# Patient Record
Sex: Male | Born: 1955 | Race: White | Hispanic: No | Marital: Married
Health system: Southern US, Community
[De-identification: ages and names within clinical notes are randomized; demographics above are authoritative.]

---

## 2012-09-11 ENCOUNTER — Ambulatory Visit: Payer: Self-pay | Admitting: Internal Medicine

## 2014-11-02 IMAGING — US US EXTREM LOW VENOUS*R*
1 series · 14 of 24 positions shown · non-contrast
Comparison: none

REASON FOR EXAM: Right leg swelling in back of calf Eval for DVT
COMMENTS:

[Series 1: us extrem low venous*right* · 0.09mm/px · 14 of 42 slices shown]
[im 1/42]
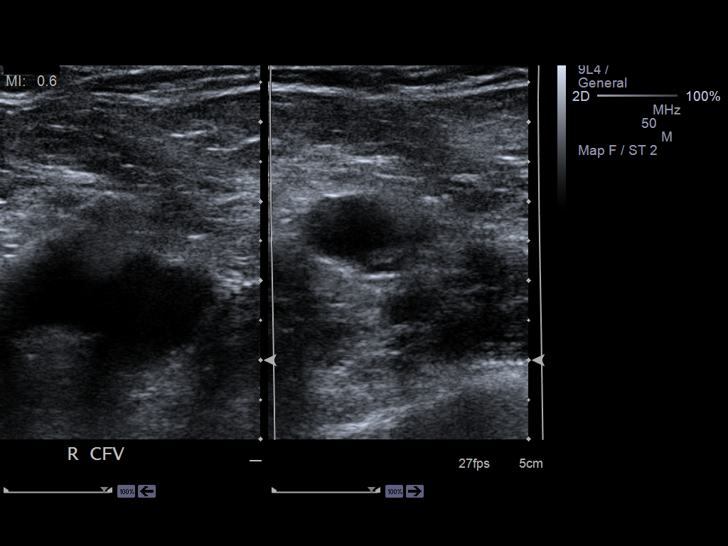
[im 4/42]
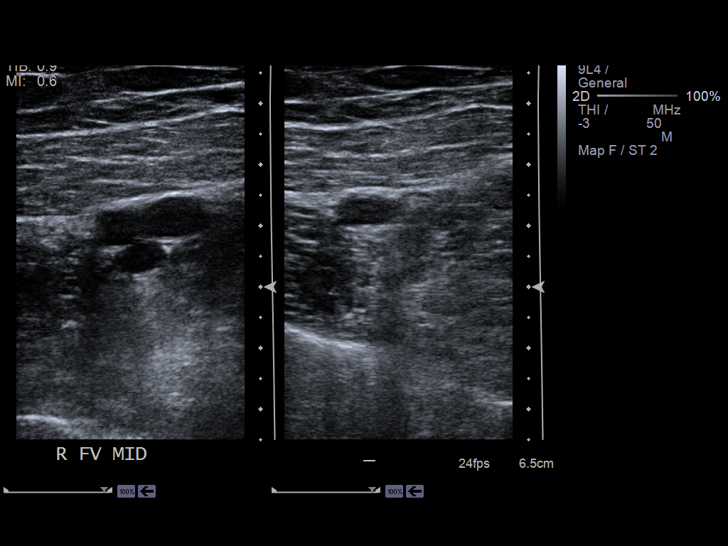
[im 8/42]
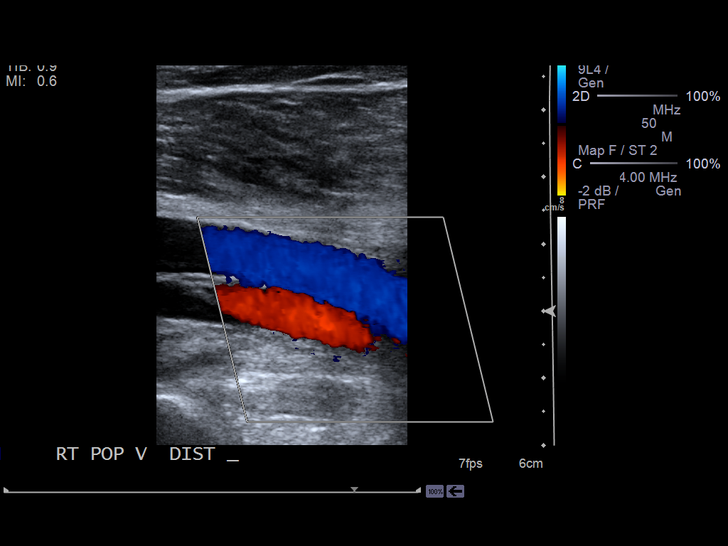
[im 11/42]
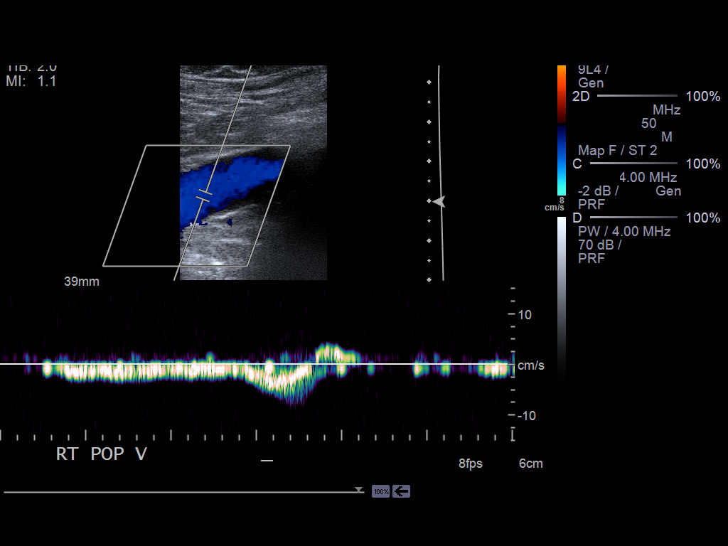
[im 13/42]
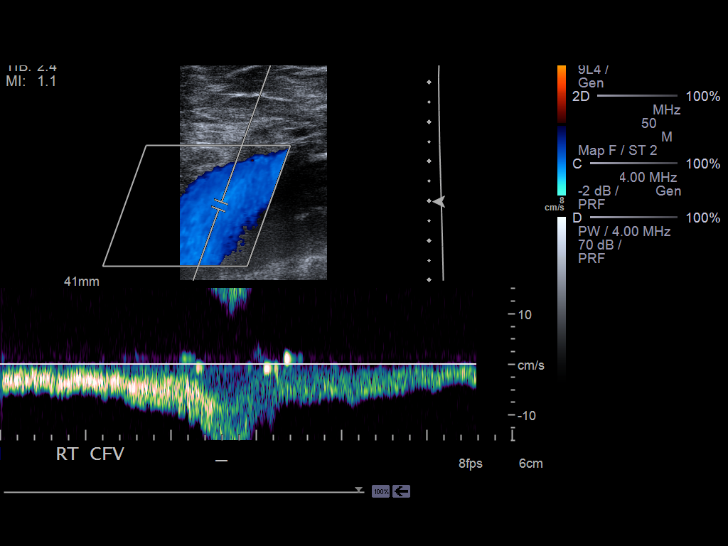
[im 17/42]
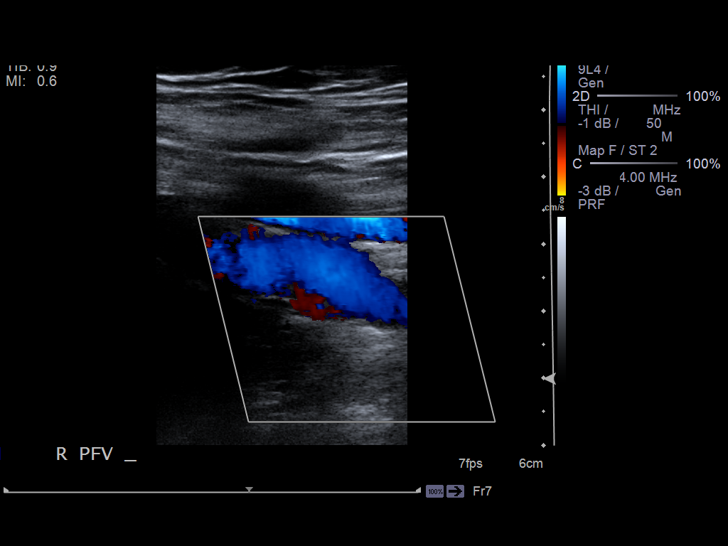
[im 20/42]
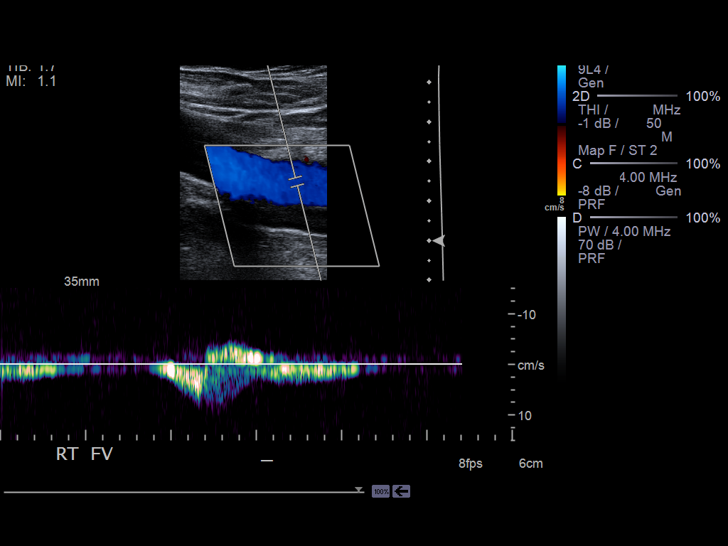
[im 22/42]
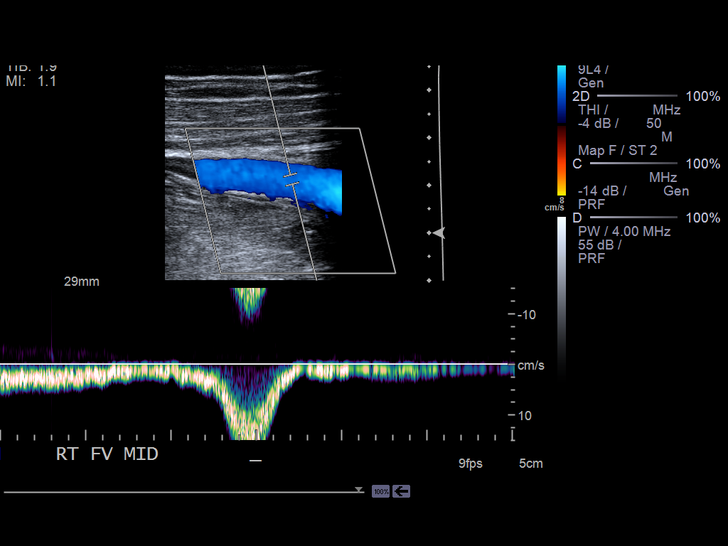
[im 25/42]
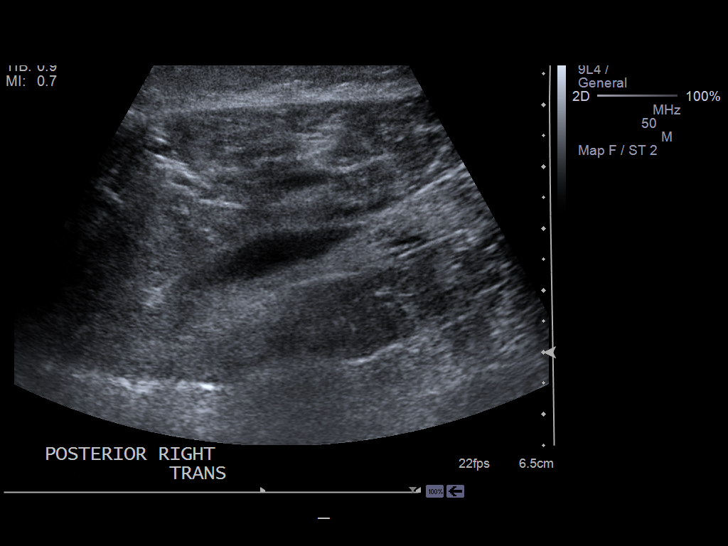
[im 29/42]
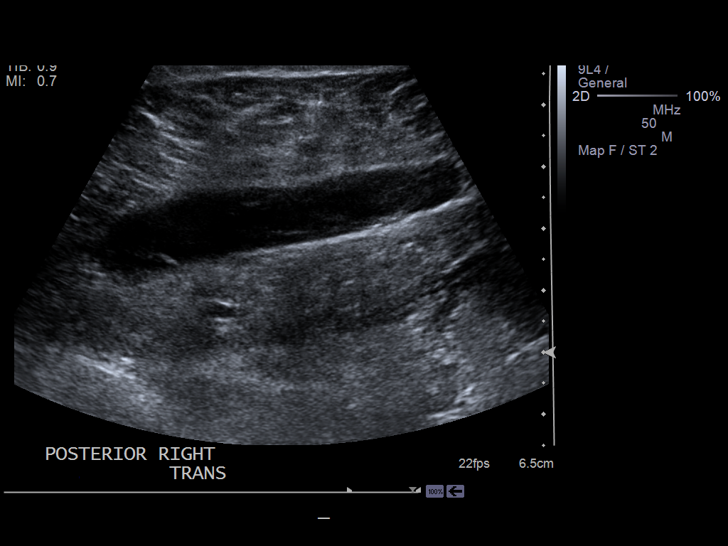
[im 33/42]
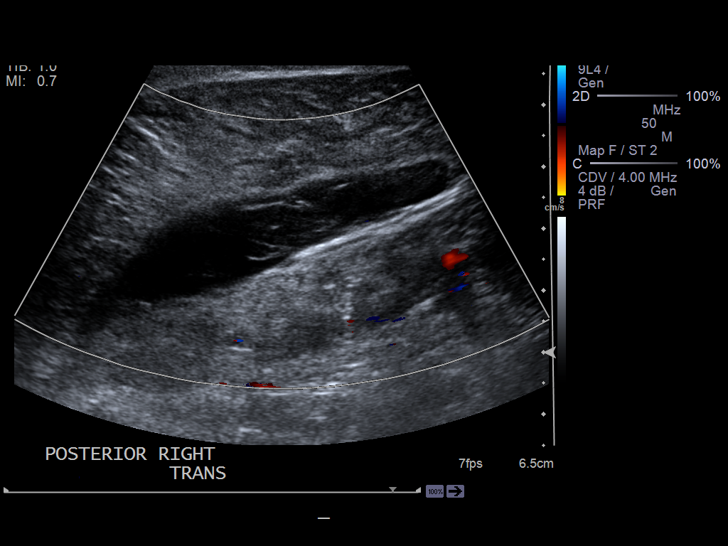
[im 34/42]
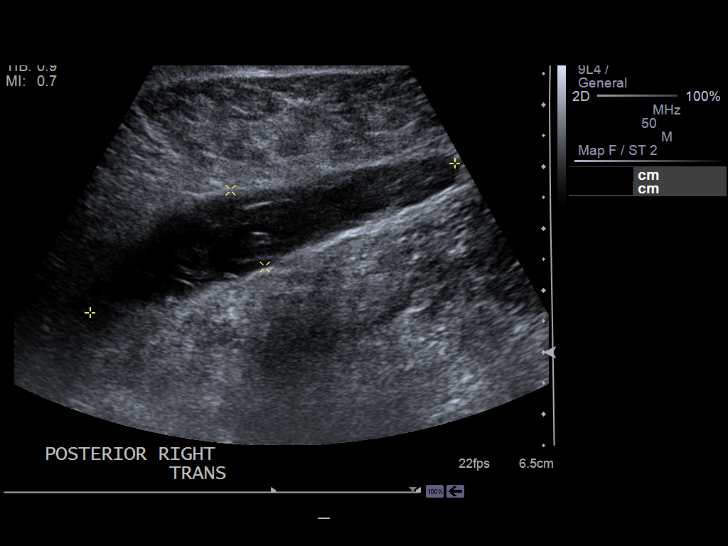
[im 38/42]
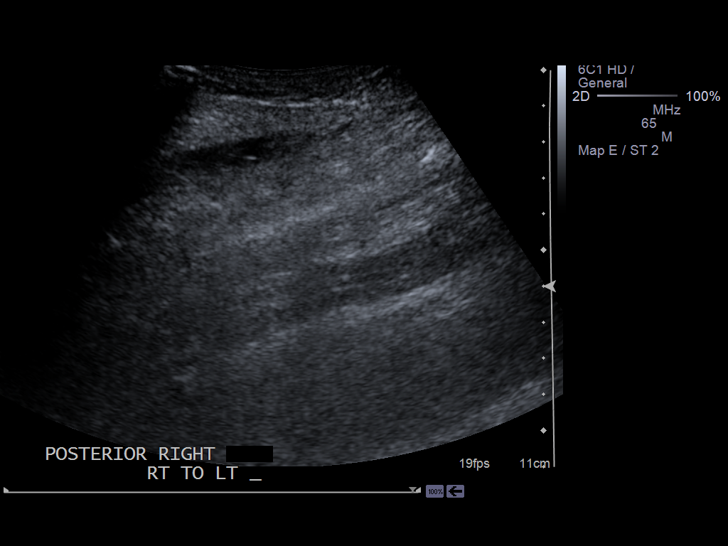
[im 42/42]
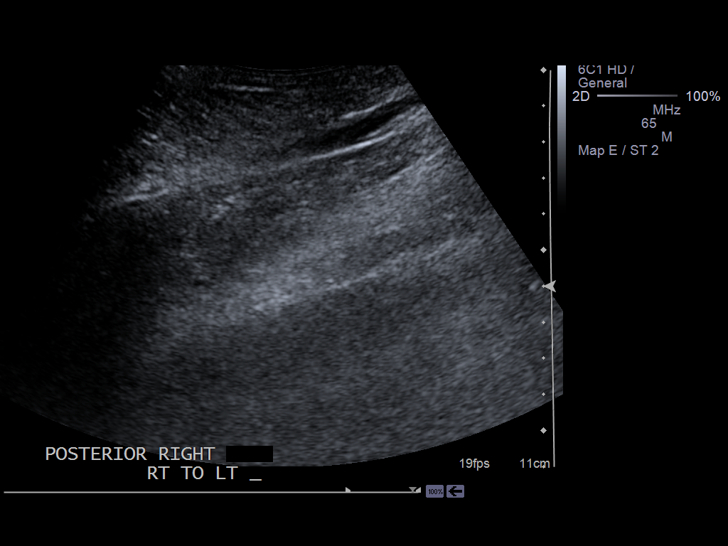

[14 of 24 positions shown; findings below may reference images not displayed]

PROCEDURE:     T-FLOW - T-FLOW DOPPLER LOW EXTR RIGHT  - September 11, 2012 [DATE]

RESULT:     Grayscale and color flow Doppler techniques were employed to
evaluate the deep veins of the right lower extremity.

The right common femoral, superficial femoral, and popliteal veins are
normally compressible. The waveform patterns are normal and the color flow
images are normal. The response to the augmentation and Valsalva maneuvers
is normal.

In the posterior aspect of the right calf there is elliptical hypoechoic
focus with internal echoes measuring 11.4 x 6.4 x 1.4 cm. This likely
reflects a hematoma.
IMPRESSION: 1. There is no evidence of thrombus within the right femoral or popliteal
veins.
2. There is a fluid collection in the posterior aspect of the right calf
which likely reflects a hematoma.

[REDACTED]

## 2018-06-26 DIAGNOSIS — C4491 Basal cell carcinoma of skin, unspecified: Secondary | ICD-10-CM

## 2018-06-26 HISTORY — DX: Basal cell carcinoma of skin, unspecified: C44.91

## 2019-02-11 ENCOUNTER — Other Ambulatory Visit: Payer: Self-pay

## 2019-02-11 DIAGNOSIS — Z20822 Contact with and (suspected) exposure to covid-19: Secondary | ICD-10-CM

## 2019-02-14 LAB — NOVEL CORONAVIRUS, NAA: SARS-CoV-2, NAA: DETECTED — AB

## 2019-10-27 ENCOUNTER — Other Ambulatory Visit: Payer: Self-pay

## 2019-10-27 ENCOUNTER — Ambulatory Visit: Payer: BC Managed Care – PPO | Admitting: Dermatology

## 2019-10-27 DIAGNOSIS — L821 Other seborrheic keratosis: Secondary | ICD-10-CM

## 2019-10-27 DIAGNOSIS — D229 Melanocytic nevi, unspecified: Secondary | ICD-10-CM

## 2019-10-27 DIAGNOSIS — L209 Atopic dermatitis, unspecified: Secondary | ICD-10-CM | POA: Diagnosis not present

## 2019-10-27 DIAGNOSIS — Z1283 Encounter for screening for malignant neoplasm of skin: Secondary | ICD-10-CM

## 2019-10-27 DIAGNOSIS — L570 Actinic keratosis: Secondary | ICD-10-CM | POA: Diagnosis not present

## 2019-10-27 DIAGNOSIS — D1800 Hemangioma unspecified site: Secondary | ICD-10-CM

## 2019-10-27 DIAGNOSIS — L820 Inflamed seborrheic keratosis: Secondary | ICD-10-CM

## 2019-10-27 DIAGNOSIS — L814 Other melanin hyperpigmentation: Secondary | ICD-10-CM

## 2019-10-27 DIAGNOSIS — Z85828 Personal history of other malignant neoplasm of skin: Secondary | ICD-10-CM

## 2019-10-27 DIAGNOSIS — L578 Other skin changes due to chronic exposure to nonionizing radiation: Secondary | ICD-10-CM

## 2019-10-27 NOTE — Patient Instructions (Signed)
Atopic Dermatitis  "Dermatitis" means inflammation of the skin.  "Atopic" dermatitis is a particular type of skin inflammation that is marked by dryness, associated itching, and a characteristic pattern of rash on the body.  The condition is fairly common and may occur in as many as 10% of children.  You will often hear it called "atopic eczema" or sometimes just "eczema".  The exact cause of atopic dermatitis is unknown.  In many patients, there is a family history of hay fever, asthma, or atopic dermatitis itself.  Rarely, atopic dermatitis in infants may be related to food sensitivity, such as sensitivity to milk, but this is often difficult to determine and manage.  In the majority of cases, however, no allergic triggers can be found.  Physical or emotional stressors (severe seasonal allergies, physical illness, etc.) can worsen atopic dermatitis.  Atopic dermatitis usually starts in infancy from the ages of 2 to 6 months.  The skin is dry and the rash is quite itchy, so infants may be restless and rub against the sheets or scratch (if able).  The rash may involve the face or it may cover a large part of the body.  As the child gets older, the rash may become more localized.  In early childhood, the rash is commonly on the legs, feet, hands and arms.  As a child becomes older, the rash may be limited to the bend of the elbows, knees, on the back of the hands, feet, and on the neck and face.  When the rash becomes more established, the dry itchy skin may become thickened, leathery and sometimes darker in coloration.  The more the person scratches, the worse the rash is and the thicker the skin gets.  Many children with atopic dermatitis outgrow the condition before school age, while others continue to have problems into adolescence and adulthood.  Many things may affect the severity of the condition.  All patients have sensitive and dry skin.  Many will find that during the winter months when the humidity  is very low, the dryness and itchiness will be worse.  On the other hand, some people are easily irritated by sweat and will find that they have more problems during the summer months.  Most patients note an increase in itching at times when there are sudden changes in temperature.  Other irritants easily affect the skin of a patient with atopic dermatitis.  Use of harsh soaps or detergents and exposure to wool are common problems.  Sometimes atopic dermatitis may become infected by bacteria, yeast or viruses.  This is called "secondary infection".  Bacterial secondary infection is the most common and is often a result of scratching.  The rash gets very red with pus-filled pimples and scabs.  If this occurs, your doctor will prescribe an antibiotic to control the infection.  A more serious complication can be caused by certain viruses.  The "cold sore" virus (herpes simplex) may cause a severe rash.  If this is suspected, immediately contact your doctor.   What can I expect from treatment? Unfortunately, there is no "magic" cure that will always eliminate atopic dermatitis.  The main objective in treating atopic dermatitis is to decrease the skin eruption and relieve the itching.  There are a number of different forms of the medications that are used for atopic dermatitis.  Primarily, topical medications will be used.  Because the skin is excessively dry, moisturizers will be recommended that will effectively decrease the dryness.  Daily bathing is  a useful way to get water into the skin but bathing should be brief (no more than 10 minutes unless otherwise indicated by your physician). ° °Effective moisturizers (Cetaphil cream or lotion, CeraVe cream or lotion [[employer], [employer], and [employer]], Aquaphor, and plain Vaseline) can be used immediately after the bath or shower to trap moisture within the skin.  It is best to “pat dry” after a bathing and then place your moisturizer (cream or lotion) on your skin.   Cortisone (steroid) is a medicated ointment or cream (eg. triamcinolone, hydrocortisone, desonide, betamethasone, clobetasol) that may also be suggested.  It is very helpful in decreasing the itching and controlling the inflammation.  Your doctor will prescribe a cortisone treatment that is most appropriate for the severity and location of the dermatitis that is to be treated.  ° °Once the affected area clears up, it is best to discontinue the use of the cortisone preparation due to possibility of atrophy (skin thinning), but continue the regular use of moisturizers to try to prevent new areas of dermatitis from occurring.  Of course, if itching or a new rash begins, the cortisone preparation may have to be started again.  Anti-inflammatory creams and ointments which are not steroids such as Protopic and Elidel may also be prescribed. ° °Certain internal medicines called antihistamines (eg. Atarax, Benadryl, hydroxyzine) may help control itching.  They primarily help with the itching by introducing some drowsiness and allowing you to sleep at night.  Some oral antibiotics are often useful as well for controlling the secondary infection and enable infected dermatitis to be controlled. ° °Other important forms of treatment: °1. Avoid contact with substances you know to cause itching.  These may include soaps, detergents, certain perfumes, dust, grass, weeds, wools, and other types of scratchy clothing. °2. You may bathe daily.  Use no soap or the minimal amount necessary to get clean.  Always use moisturizer immediately after bathing (within 3 minutes is best).  Avoid very hot or very cold water.  Avoid bubble baths.  When drying with a towel, pat dry and do not rub. Use a mild, unscented soap (Dove, CeraVe Cleanser, Lever 2000, or Cetaphil). °3. Try to keep the temperature and humidity in the home fairly constant.  Use a bedroom air conditioner in the summer and a humidifier in the winter.  It is very important that  the humidifier be cleaned frequently and thoroughly since mold may grow and cause allergies. °4. Try to avoid scratching.  Atopic dermatitis is often called “the itch that rashes” and it is known that scratching plays a significant role in making atopic dermatitis worse.  Keeping the nails short and well-filed is helpful. °5. Use a fragrance-free, sensitive skin laundry detergent (eg. All Free & Clear).  Run clothes through a second rinse cycle to remove any residual detergents and chemicals.  Bed linens and towels should be washed in hot water to kill dust mites, which are common allergen in atopic patients. °6. In the bedroom, minimize rugs and curtains or other loose fabrics that collect dust. ° °The [employer]cz[employer] Association (www.eczema-assn.org) is a wonderful organization that sends out a quarterly newsletter with useful information on these types of conditions. Please consider contacting them at the above website or by address: [employer], [address], [address], [place], [address]  ° ° °

## 2019-10-27 NOTE — Progress Notes (Signed)
Follow-Up Visit   Subjective  Nicholas Esparza is a 64 y.o. male who presents for the following: Annual Exam (Hx of BCC - no new or changing moles, lesions, or spots that the patient has noticed) and atopic dermatitis (patient currently using Mometasone 0.1% and Eucrisa 2% ointment PRN). The patient presents for Total-Body Skin Exam (TBSE) for skin cancer screening and mole check.  The following portions of the chart were reviewed this encounter and updated as appropriate:  Allergies  Meds  Problems  Med Hx  Surg Hx  Fam Hx     Review of Systems:  No other skin or systemic complaints except as noted in HPI or Assessment and Plan.  Objective  Well appearing patient in no apparent distress; mood and affect are within normal limits.  A full examination was performed including scalp, head, eyes, ears, nose, lips, neck, chest, axillae, abdomen, back, buttocks, bilateral upper extremities, bilateral lower extremities, hands, feet, fingers, toes, fingernails, and toenails. All findings within normal limits unless otherwise noted below.  Objective  B/L antecubital: Pink patches   Objective  L temple: Erythematous thin papules/macules with gritty scale.   Objective  R elbow: Erythematous keratotic or waxy stuck-on papule or plaque.   Assessment & Plan    Atopic dermatitis affecting mostly arms B/L antecubital Continue Eucrisa 2% oint QD-BID PRN, and Mometasone 1% ointment for severe or persistent areas QD -BID PRN.   AK (actinic keratosis) L temple  Destruction of lesion - L temple Complexity: simple   Destruction method: cryotherapy   Informed consent: discussed and consent obtained   Timeout:  patient name, date of birth, surgical site, and procedure verified Lesion destroyed using liquid nitrogen: Yes   Region frozen until ice ball extended beyond lesion: Yes   Outcome: patient tolerated procedure well with no complications   Post-procedure details: wound care  instructions given    Inflamed seborrheic keratosis R elbow  Destruction of lesion - R elbow Complexity: simple   Destruction method: cryotherapy   Informed consent: discussed and consent obtained   Timeout:  patient name, date of birth, surgical site, and procedure verified Lesion destroyed using liquid nitrogen: Yes   Region frozen until ice ball extended beyond lesion: Yes   Outcome: patient tolerated procedure well with no complications   Post-procedure details: wound care instructions given     Lentigines - Scattered tan macules - Discussed due to sun exposure - Benign, observe - Call for any changes  Seborrheic Keratoses - Stuck-on, waxy, tan-brown papules and plaques  - Discussed benign etiology and prognosis. - Observe - Call for any changes  Melanocytic Nevi - Tan-brown and/or pink-flesh-colored symmetric macules and papules - Benign appearing on exam today - Observation - Call clinic for new or changing moles - Recommend daily use of broad spectrum spf 30+ sunscreen to sun-exposed areas.   Hemangiomas - Red papules - Discussed benign nature - Observe - Call for any changes  Actinic Damage - diffuse scaly erythematous macules with underlying dyspigmentation - Recommend daily broad spectrum sunscreen SPF 30+ to sun-exposed areas, reapply every 2 hours as needed.  - Call for new or changing lesions.  History of Basal Cell Carcinoma of the Skin - No evidence of recurrence today - Recommend regular full body skin exams - Recommend daily broad spectrum sunscreen SPF 30+ to sun-exposed areas, reapply every 2 hours as needed.  - Call if any new or changing lesions are noted between office visits  Skin cancer screening performed  today.  Return in about 1 year (around 10/26/2020) for TBSE.  Luther Redo, CMA, am acting as scribe for Sarina Ser, MD .  Documentation: I have reviewed the above documentation for accuracy and completeness, and I agree with  the above.  Sarina Ser, MD

## 2019-10-28 ENCOUNTER — Encounter: Payer: Self-pay | Admitting: Dermatology

## 2020-11-03 ENCOUNTER — Ambulatory Visit: Payer: BC Managed Care – PPO | Admitting: Dermatology

## 2020-11-03 ENCOUNTER — Other Ambulatory Visit: Payer: Self-pay

## 2020-11-03 ENCOUNTER — Encounter: Payer: Self-pay | Admitting: Dermatology

## 2020-11-03 DIAGNOSIS — Z1283 Encounter for screening for malignant neoplasm of skin: Secondary | ICD-10-CM | POA: Diagnosis not present

## 2020-11-03 DIAGNOSIS — L821 Other seborrheic keratosis: Secondary | ICD-10-CM

## 2020-11-03 DIAGNOSIS — L578 Other skin changes due to chronic exposure to nonionizing radiation: Secondary | ICD-10-CM

## 2020-11-03 DIAGNOSIS — L570 Actinic keratosis: Secondary | ICD-10-CM

## 2020-11-03 DIAGNOSIS — L814 Other melanin hyperpigmentation: Secondary | ICD-10-CM

## 2020-11-03 DIAGNOSIS — L209 Atopic dermatitis, unspecified: Secondary | ICD-10-CM | POA: Diagnosis not present

## 2020-11-03 DIAGNOSIS — D1800 Hemangioma unspecified site: Secondary | ICD-10-CM

## 2020-11-03 DIAGNOSIS — Z85828 Personal history of other malignant neoplasm of skin: Secondary | ICD-10-CM | POA: Diagnosis not present

## 2020-11-03 DIAGNOSIS — D229 Melanocytic nevi, unspecified: Secondary | ICD-10-CM

## 2020-11-03 NOTE — Patient Instructions (Signed)

## 2020-11-03 NOTE — Progress Notes (Signed)
Follow-Up Visit   Subjective  Nicholas Esparza is a 65 y.o. male who presents for the following: Annual Exam (Hx BCC - patient states that eczema has been stable on Eucrisa and Mometasone ointment PRN.). The patient presents for Total-Body Skin Exam (TBSE) for skin cancer screening and mole check.  The following portions of the chart were reviewed this encounter and updated as appropriate:   Allergies  Meds  Problems  Med Hx  Surg Hx  Fam Hx     Review of Systems:  No other skin or systemic complaints except as noted in HPI or Assessment and Plan.  Objective  Well appearing patient in no apparent distress; mood and affect are within normal limits.  A full examination was performed including scalp, head, eyes, ears, nose, lips, neck, chest, axillae, abdomen, back, buttocks, bilateral upper extremities, bilateral lower extremities, hands, feet, fingers, toes, fingernails, and toenails. All findings within normal limits unless otherwise noted below.  Chest - Medial (Center) Clear.   Scalp (2) Erythematous thin papules/macules with gritty scale.   Assessment & Plan  Atopic dermatitis,  Chest - Medial (Center) Atopic dermatitis (eczema) is a chronic, relapsing, pruritic condition that can significantly affect quality of life. It is often associated with allergic rhinitis and/or asthma and can require treatment with topical medications, phototherapy, or in severe cases a biologic medication called Dupixent in children and adults.   Continue Mometasone 0.1% ointment to aa's body QD-BID up to 5d/wk.  And Continue Eucrisa 2% ointment to aa's BID PRN.   Topical steroids (such as triamcinolone, fluocinolone, fluocinonide, mometasone, clobetasol, halobetasol, betamethasone, hydrocortisone) can cause thinning and lightening of the skin if they are used for too long in the same area. Your physician has selected the right strength medicine for your problem and area affected on the body.  Please use your medication only as directed by your physician to prevent side effects.   Continue Eucrisa 2% ointment to aa's BID PRN.   AK (actinic keratosis) (2) Scalp  Destruction of lesion - Scalp Complexity: simple   Destruction method: cryotherapy   Informed consent: discussed and consent obtained   Timeout:  patient name, date of birth, surgical site, and procedure verified Lesion destroyed using liquid nitrogen: Yes   Region frozen until ice ball extended beyond lesion: Yes   Outcome: patient tolerated procedure well with no complications   Post-procedure details: wound care instructions given    Skin cancer screening  Lentigines - Scattered tan macules - Due to sun exposure - Benign-appering, observe - Recommend daily broad spectrum sunscreen SPF 30+ to sun-exposed areas, reapply every 2 hours as needed. - Call for any changes  Seborrheic Keratoses - Stuck-on, waxy, tan-brown papules and/or plaques  - Benign-appearing - Discussed benign etiology and prognosis. - Observe - Call for any changes  Melanocytic Nevi - Tan-brown and/or pink-flesh-colored symmetric macules and papules - Benign appearing on exam today - Observation - Call clinic for new or changing moles - Recommend daily use of broad spectrum spf 30+ sunscreen to sun-exposed areas.   Hemangiomas - Red papules - Discussed benign nature - Observe - Call for any changes  Actinic Damage - Chronic condition, secondary to cumulative UV/sun exposure - diffuse scaly erythematous macules with underlying dyspigmentation - Recommend daily broad spectrum sunscreen SPF 30+ to sun-exposed areas, reapply every 2 hours as needed.  - Staying in the shade or wearing long sleeves, sun glasses (UVA+UVB protection) and wide brim hats (4-inch brim around the entire circumference  of the hat) are also recommended for sun protection.  - Call for new or changing lesions.  History of Basal Cell Carcinoma of the Skin - No  evidence of recurrence today - Recommend regular full body skin exams - Recommend daily broad spectrum sunscreen SPF 30+ to sun-exposed areas, reapply every 2 hours as needed.  - Call if any new or changing lesions are noted between office visits  Skin cancer screening performed today.  Return in about 1 year (around 11/03/2021) for TBSE.  Luther Redo, CMA, am acting as scribe for Sarina Ser, MD .  Documentation: I have reviewed the above documentation for accuracy and completeness, and I agree with the above.  Sarina Ser, MD

## 2021-11-10 ENCOUNTER — Ambulatory Visit: Payer: BC Managed Care – PPO | Admitting: Dermatology

## 2021-11-10 DIAGNOSIS — D229 Melanocytic nevi, unspecified: Secondary | ICD-10-CM | POA: Diagnosis not present

## 2021-11-10 DIAGNOSIS — D1800 Hemangioma unspecified site: Secondary | ICD-10-CM

## 2021-11-10 DIAGNOSIS — L820 Inflamed seborrheic keratosis: Secondary | ICD-10-CM

## 2021-11-10 DIAGNOSIS — L570 Actinic keratosis: Secondary | ICD-10-CM | POA: Diagnosis not present

## 2021-11-10 DIAGNOSIS — L578 Other skin changes due to chronic exposure to nonionizing radiation: Secondary | ICD-10-CM

## 2021-11-10 DIAGNOSIS — Z1283 Encounter for screening for malignant neoplasm of skin: Secondary | ICD-10-CM

## 2021-11-10 DIAGNOSIS — L821 Other seborrheic keratosis: Secondary | ICD-10-CM

## 2021-11-10 DIAGNOSIS — Z85828 Personal history of other malignant neoplasm of skin: Secondary | ICD-10-CM | POA: Diagnosis not present

## 2021-11-10 DIAGNOSIS — L814 Other melanin hyperpigmentation: Secondary | ICD-10-CM

## 2021-11-10 NOTE — Progress Notes (Signed)
Follow-Up Visit   Subjective  Nicholas Esparza is a 66 y.o. male who presents for the following: Annual Exam (1 year tbse, hx of atopic dermatitis, hx of aks, hx of bcc. Reports sore spot behind right ear and some spots at scalp he would like checked. ). The patient presents for Total-Body Skin Exam (TBSE) for skin cancer screening and mole check.  The patient has spots, moles and lesions to be evaluated, some may be new or changing and the patient has concerns that these could be cancer.  The following portions of the chart were reviewed this encounter and updated as appropriate:  Allergies  Meds  Problems  Med Hx  Surg Hx  Fam Hx     Review of Systems: No other skin or systemic complaints except as noted in HPI or Assessment and Plan.  Objective  Well appearing patient in no apparent distress; mood and affect are within normal limits.  A full examination was performed including scalp, head, eyes, ears, nose, lips, neck, chest, axillae, abdomen, back, buttocks, bilateral upper extremities, bilateral lower extremities, hands, feet, fingers, toes, fingernails, and toenails. All findings within normal limits unless otherwise noted below.  right ear x 1, scalp x 1 (2) Erythematous thin papules/macules with gritty scale.   left scalp x 1 Erythematous stuck-on, waxy papule or plaque   Assessment & Plan  Actinic keratosis (2) right ear x 1, scalp x 1 Actinic keratoses are precancerous spots that appear secondary to cumulative UV radiation exposure/sun exposure over time. They are chronic with expected duration over 1 year. A portion of actinic keratoses will progress to squamous cell carcinoma of the skin. It is not possible to reliably predict which spots will progress to skin cancer and so treatment is recommended to prevent development of skin cancer.  Recommend daily broad spectrum sunscreen SPF 30+ to sun-exposed areas, reapply every 2 hours as needed.  Recommend staying in the  shade or wearing long sleeves, sun glasses (UVA+UVB protection) and wide brim hats (4-inch brim around the entire circumference of the hat). Call for new or changing lesions.  Destruction of lesion - right ear x 1, scalp x 1 Complexity: simple   Destruction method: cryotherapy   Informed consent: discussed and consent obtained   Timeout:  patient name, date of birth, surgical site, and procedure verified Lesion destroyed using liquid nitrogen: Yes   Region frozen until ice ball extended beyond lesion: Yes   Outcome: patient tolerated procedure well with no complications   Post-procedure details: wound care instructions given   Additional details:  Prior to procedure, discussed risks of blister formation, small wound, skin dyspigmentation, or rare scar following cryotherapy. Recommend Vaseline ointment to treated areas while healing.  Inflamed seborrheic keratosis left scalp x 1 Symptomatic, irritating, patient would like treated. Destruction of lesion - left scalp x 1 Complexity: simple   Destruction method: cryotherapy   Informed consent: discussed and consent obtained   Timeout:  patient name, date of birth, surgical site, and procedure verified Lesion destroyed using liquid nitrogen: Yes   Region frozen until ice ball extended beyond lesion: Yes   Outcome: patient tolerated procedure well with no complications   Post-procedure details: wound care instructions given   Additional details:  Prior to procedure, discussed risks of blister formation, small wound, skin dyspigmentation, or rare scar following cryotherapy. Recommend Vaseline ointment to treated areas while healing.  Lentigines - Scattered tan macules - Due to sun exposure - Benign-appearing, observe - Recommend daily  broad spectrum sunscreen SPF 30+ to sun-exposed areas, reapply every 2 hours as needed. - Call for any changes  Seborrheic Keratoses - Stuck-on, waxy, tan-brown papules and/or plaques  -  Benign-appearing - Discussed benign etiology and prognosis. - Observe - Call for any changes  Melanocytic Nevi - Tan-brown and/or pink-flesh-colored symmetric macules and papules - Benign appearing on exam today - Observation - Call clinic for new or changing moles - Recommend daily use of broad spectrum spf 30+ sunscreen to sun-exposed areas.   Hemangiomas - Red papules - Discussed benign nature - Observe - Call for any changes  Actinic Damage - Chronic condition, secondary to cumulative UV/sun exposure - diffuse scaly erythematous macules with underlying dyspigmentation - Recommend daily broad spectrum sunscreen SPF 30+ to sun-exposed areas, reapply every 2 hours as needed.  - Staying in the shade or wearing long sleeves, sun glasses (UVA+UVB protection) and wide brim hats (4-inch brim around the entire circumference of the hat) are also recommended for sun protection.  - Call for new or changing lesions.  History of Basal Cell Carcinoma of the Skin At left upper scapula and right temple lateral canthus 2020 - No evidence of recurrence today - Recommend regular full body skin exams - Recommend daily broad spectrum sunscreen SPF 30+ to sun-exposed areas, reapply every 2 hours as needed.  - Call if any new or changing lesions are noted between office visits  Skin cancer screening performed today. Return in about 1 year (around 11/11/2022) for TBSE. IRuthell Rummage, CMA, am acting as scribe for Sarina Ser, MD. Documentation: I have reviewed the above documentation for accuracy and completeness, and I agree with the above.  Sarina Ser, MD

## 2021-11-10 NOTE — Patient Instructions (Addendum)
Actinic keratoses are precancerous spots that appear secondary to cumulative UV radiation exposure/sun exposure over time. They are chronic with expected duration over 1 year. A portion of actinic keratoses will progress to squamous cell carcinoma of the skin. It is not possible to reliably predict which spots will progress to skin cancer and so treatment is recommended to prevent development of skin cancer.  Recommend daily broad spectrum sunscreen SPF 30+ to sun-exposed areas, reapply every 2 hours as needed.  Recommend staying in the shade or wearing long sleeves, sun glasses (UVA+UVB protection) and wide brim hats (4-inch brim around the entire circumference of the hat). Call for new or changing lesions.   Cryotherapy Aftercare  Wash gently with soap and water everyday.   Apply Vaseline and Band-Aid daily until healed.    Seborrheic Keratosis  What causes seborrheic keratoses? Seborrheic keratoses are harmless, common skin growths that first appear during adult life.  As time goes by, more growths appear.  Some people may develop a large number of them.  Seborrheic keratoses appear on both covered and uncovered body parts.  They are not caused by sunlight.  The tendency to develop seborrheic keratoses can be inherited.  They vary in color from skin-colored to gray, brown, or even black.  They can be either smooth or have a rough, warty surface.   Seborrheic keratoses are superficial and look as if they were stuck on the skin.  Under the microscope this type of keratosis looks like layers upon layers of skin.  That is why at times the top layer may seem to fall off, but the rest of the growth remains and re-grows.    Treatment Seborrheic keratoses do not need to be treated, but can easily be removed in the office.  Seborrheic keratoses often cause symptoms when they rub on clothing or jewelry.  Lesions can be in the way of shaving.  If they become inflamed, they can cause itching, soreness, or  burning.  Removal of a seborrheic keratosis can be accomplished by freezing, burning, or surgery. If any spot bleeds, scabs, or grows rapidly, please return to have it checked, as these can be an indication of a skin cancer.  Melanoma ABCDEs  Melanoma is the most dangerous type of skin cancer, and is the leading cause of death from skin disease.  You are more likely to develop melanoma if you: Have light-colored skin, light-colored eyes, or red or blond hair Spend a lot of time in the sun Tan regularly, either outdoors or in a tanning bed Have had blistering sunburns, especially during childhood Have a close family member who has had a melanoma Have atypical moles or large birthmarks  Early detection of melanoma is key since treatment is typically straightforward and cure rates are extremely high if we catch it early.   The first sign of melanoma is often a change in a mole or a new dark spot.  The ABCDE system is a way of remembering the signs of melanoma.  A for asymmetry:  The two halves do not match. B for border:  The edges of the growth are irregular. C for color:  A mixture of colors are present instead of an even brown color. D for diameter:  Melanomas are usually (but not always) greater than 6mm - the size of a pencil eraser. E for evolution:  The spot keeps changing in size, shape, and color.  Please check your skin once per month between visits. You can use a small   mirror in front and a large mirror behind you to keep an eye on the back side or your body.   If you see any new or changing lesions before your next follow-up, please call to schedule a visit.  Please continue daily skin protection including broad spectrum sunscreen SPF 30+ to sun-exposed areas, reapplying every 2 hours as needed when you're outdoors.   Staying in the shade or wearing long sleeves, sun glasses (UVA+UVB protection) and wide brim hats (4-inch brim around the entire circumference of the hat) are also  recommended for sun protection.    Due to recent changes in healthcare laws, you may see results of your pathology and/or laboratory studies on MyChart before the doctors have had a chance to review them. We understand that in some cases there may be results that are confusing or concerning to you. Please understand that not all results are received at the same time and often the doctors may need to interpret multiple results in order to provide you with the best plan of care or course of treatment. Therefore, we ask that you please give us 2 business days to thoroughly review all your results before contacting the office for clarification. Should we see a critical lab result, you will be contacted sooner.   If You Need Anything After Your Visit  If you have any questions or concerns for your doctor, please call our main line at [phone] and press option 4 to reach your doctor's medical assistant. If no one answers, please leave a voicemail as directed and we will return your call as soon as possible. Messages left after 4 pm will be answered the following business day.   You may also send us a message via MyChart. We typically respond to MyChart messages within 1-2 business days.  For prescription refills, please ask your pharmacy to contact our office. Our fax number is [phone].  If you have an urgent issue when the clinic is closed that cannot wait until the next business day, you can page your doctor at the number below.    Please note that while we do our best to be available for urgent issues outside of office hours, we are not available 24/7.   If you have an urgent issue and are unable to reach us, you may choose to seek medical care at your doctor's office, retail clinic, urgent care center, or emergency room.  If you have a medical emergency, please immediately call 911 or go to the emergency department.  Pager Numbers  - Dr. Kowalski: [phone]  - Dr. Moye:  [phone]  - Dr. Stewart: [phone]  In the event of inclement weather, please call our main line at [phone] for an update on the status of any delays or closures.  Dermatology Medication Tips: Please keep the boxes that topical medications come in in order to help keep track of the instructions about where and how to use these. Pharmacies typically print the medication instructions only on the boxes and not directly on the medication tubes.   If your medication is too expensive, please contact our office at [phone] option 4 or send us a message through MyChart.   We are unable to tell what your co-pay for medications will be in advance as this is different depending on your insurance coverage. However, we may be able to find a substitute medication at lower cost or fill out paperwork to get insurance to cover a needed medication.   If a prior authorization   is required to get your medication covered by your insurance company, please allow us 1-2 business days to complete this process.  Drug prices often vary depending on where the prescription is filled and some pharmacies may offer cheaper prices.  The website www.goodrx.com contains coupons for medications through different pharmacies. The prices here do not account for what the cost may be with help from insurance (it may be cheaper with your insurance), but the website can give you the price if you did not use any insurance.  - You can print the associated coupon and take it with your prescription to the pharmacy.  - You may also stop by our office during regular business hours and pick up a GoodRx coupon card.  - If you need your prescription sent electronically to a different pharmacy, notify our office through [facility] MyChart or by phone at [phone] option 4.     Si Usted Necesita Algo Despus de Su Visita  Tambin puede enviarnos un mensaje a travs de MyChart. Por lo general respondemos a los mensajes de  MyChart en el transcurso de 1 a 2 das hbiles.  Para renovar recetas, por favor pida a su farmacia que se ponga en contacto con nuestra oficina. Nuestro nmero de fax es el [phone].  Si tiene un asunto urgente cuando la clnica est cerrada y que no puede esperar hasta el siguiente da hbil, puede llamar/localizar a su doctor(a) al nmero que aparece a continuacin.   Por favor, tenga en cuenta que aunque hacemos todo lo posible para estar disponibles para asuntos urgentes fuera del horario de oficina, no estamos disponibles las 24 horas del da, los 7 das de la semana.   Si tiene un problema urgente y no puede comunicarse con nosotros, puede optar por buscar atencin mdica  en el consultorio de su doctor(a), en una clnica privada, en un centro de atencin urgente o en una sala de emergencias.  Si tiene una emergencia mdica, por favor llame inmediatamente al 911 o vaya a la sala de emergencias.  Nmeros de bper  - Dr. Kowalski: [phone]  - Dra. Moye: [phone]  - Dra. Stewart: [phone]  En caso de inclemencias del tiempo, por favor llame a nuestra lnea principal al [phone] para una actualizacin sobre el estado de cualquier retraso o cierre.  Consejos para la medicacin en dermatologa: Por favor, guarde las cajas en las que vienen los medicamentos de uso tpico para ayudarle a seguir las instrucciones sobre dnde y cmo usarlos. Las farmacias generalmente imprimen las instrucciones del medicamento slo en las cajas y no directamente en los tubos del medicamento.   Si su medicamento es muy caro, por favor, pngase en contacto con nuestra oficina llamando al [phone] y presione la opcin 4 o envenos un mensaje a travs de MyChart.   No podemos decirle cul ser su copago por los medicamentos por adelantado ya que esto es diferente dependiendo de la cobertura de su seguro. Sin embargo, es posible que podamos encontrar un medicamento sustituto a menor costo o  llenar un formulario para que el seguro cubra el medicamento que se considera necesario.   Si se requiere una autorizacin previa para que su compaa de seguros cubra su medicamento, por favor permtanos de 1 a 2 das hbiles para completar este proceso.  Los precios de los medicamentos varan con frecuencia dependiendo del lugar de dnde se surte la receta y alguna farmacias pueden ofrecer precios ms baratos.  El sitio web www.goodrx.com tiene cupones para medicamentos   de diferentes farmacias. Los precios aqu no tienen en cuenta lo que podra costar con la ayuda del seguro (puede ser ms barato con su seguro), pero el sitio web puede darle el precio si no utiliz ningn seguro.  - Puede imprimir el cupn correspondiente y llevarlo con su receta a la farmacia.  - Tambin puede pasar por nuestra oficina durante el horario de atencin regular y recoger una tarjeta de cupones de GoodRx.  - Si necesita que su receta se enve electrnicamente a una farmacia diferente, informe a nuestra oficina a travs de MyChart de  o por telfono llamando al [phone] y presione la opcin 4.  

## 2021-11-15 ENCOUNTER — Encounter: Payer: Self-pay | Admitting: Dermatology

## 2022-12-14 ENCOUNTER — Ambulatory Visit: Payer: Medicare PPO | Admitting: Dermatology

## 2022-12-14 VITALS — BP 124/84 | HR 69

## 2022-12-14 DIAGNOSIS — L821 Other seborrheic keratosis: Secondary | ICD-10-CM

## 2022-12-14 DIAGNOSIS — L814 Other melanin hyperpigmentation: Secondary | ICD-10-CM

## 2022-12-14 DIAGNOSIS — Z1283 Encounter for screening for malignant neoplasm of skin: Secondary | ICD-10-CM | POA: Diagnosis not present

## 2022-12-14 DIAGNOSIS — W908XXA Exposure to other nonionizing radiation, initial encounter: Secondary | ICD-10-CM

## 2022-12-14 DIAGNOSIS — L570 Actinic keratosis: Secondary | ICD-10-CM | POA: Diagnosis not present

## 2022-12-14 DIAGNOSIS — D1801 Hemangioma of skin and subcutaneous tissue: Secondary | ICD-10-CM

## 2022-12-14 DIAGNOSIS — L918 Other hypertrophic disorders of the skin: Secondary | ICD-10-CM

## 2022-12-14 DIAGNOSIS — D229 Melanocytic nevi, unspecified: Secondary | ICD-10-CM

## 2022-12-14 DIAGNOSIS — L578 Other skin changes due to chronic exposure to nonionizing radiation: Secondary | ICD-10-CM | POA: Diagnosis not present

## 2022-12-14 DIAGNOSIS — D2371 Other benign neoplasm of skin of right lower limb, including hip: Secondary | ICD-10-CM

## 2022-12-14 DIAGNOSIS — Z85828 Personal history of other malignant neoplasm of skin: Secondary | ICD-10-CM

## 2022-12-14 DIAGNOSIS — D692 Other nonthrombocytopenic purpura: Secondary | ICD-10-CM

## 2022-12-14 DIAGNOSIS — D239 Other benign neoplasm of skin, unspecified: Secondary | ICD-10-CM

## 2022-12-14 NOTE — Patient Instructions (Signed)

## 2022-12-14 NOTE — Progress Notes (Signed)
Follow-Up Visit   Subjective  Nicholas Esparza is a 67 y.o. male who presents for the following: Skin Cancer Screening and Full Body Skin Exam  The patient presents for Total-Body Skin Exam (TBSE) for skin cancer screening and mole check. The patient has spots, moles and lesions to be evaluated, some may be new or changing and the patient may have concern these could be cancer.   The following portions of the chart were reviewed this encounter and updated as appropriate: medications, allergies, medical history  Review of Systems:  No other skin or systemic complaints except as noted in HPI or Assessment and Plan.  Objective  Well appearing patient in no apparent distress; mood and affect are within normal limits.  A full examination was performed including scalp, head, eyes, ears, nose, lips, neck, chest, axillae, abdomen, back, buttocks, bilateral upper extremities, bilateral lower extremities, hands, feet, fingers, toes, fingernails, and toenails. All findings within normal limits unless otherwise noted below.   Relevant physical exam findings are noted in the Assessment and Plan.  L temple x 3, R temple x 1, nose x 1 (5) Erythematous thin papules/macules with gritty scale.     Assessment & Plan   SKIN CANCER SCREENING PERFORMED TODAY.  ACTINIC DAMAGE - Chronic condition, secondary to cumulative UV/sun exposure - diffuse scaly erythematous macules with underlying dyspigmentation - Recommend daily broad spectrum sunscreen SPF 30+ to sun-exposed areas, reapply every 2 hours as needed.  - Staying in the shade or wearing long sleeves, sun glasses (UVA+UVB protection) and wide brim hats (4-inch brim around the entire circumference of the hat) are also recommended for sun protection.  - Call for new or changing lesions.  LENTIGINES, SEBORRHEIC KERATOSES, HEMANGIOMAS - Benign normal skin lesions - Benign-appearing - Call for any changes  MELANOCYTIC NEVI - Tan-brown and/or  pink-flesh-colored symmetric macules and papules - Benign appearing on exam today - Observation - Call clinic for new or changing moles - Recommend daily use of broad spectrum spf 30+ sunscreen to sun-exposed areas.   HISTORY OF BASAL CELL CARCINOMA OF THE SKIN - No evidence of recurrence today - Recommend regular full body skin exams - Recommend daily broad spectrum sunscreen SPF 30+ to sun-exposed areas, reapply every 2 hours as needed.  - Call if any new or changing lesions are noted between office visits  Purpura - Chronic; persistent and recurrent.  Treatable, but not curable. - Violaceous macules and patches - Benign - Related to trauma, age, sun damage and/or use of blood thinners, chronic use of topical and/or oral steroids - Observe - Can use OTC arnica containing moisturizer such as Dermend Bruise Formula if desired - Call for worsening or other concerns  DERMATOFIBROMA - R post thigh  Exam: Firm pink/brown papulenodule with dimple sign. Treatment Plan: A dermatofibroma is a benign growth possibly related to trauma, such as an insect bite, cut from shaving, or inflamed acne-type bump.  Treatment options to remove include shave or excision with resulting scar and risk of recurrence.  Since benign-appearing and not bothersome, will observe for now.   Acrochordons (Skin Tags) - Fleshy, skin-colored pedunculated papules - Benign appearing.  - Observe. - If desired, they can be removed with an in office procedure that is not covered by insurance. - Please call the clinic if you notice any new or changing lesions.  AK (actinic keratosis) (5) L temple x 3, R temple x 1, nose x 1  Actinic keratoses are precancerous spots that appear secondary  to cumulative UV radiation exposure/sun exposure over time. They are chronic with expected duration over 1 year. A portion of actinic keratoses will progress to squamous cell carcinoma of the skin. It is not possible to reliably predict  which spots will progress to skin cancer and so treatment is recommended to prevent development of skin cancer.  Recommend daily broad spectrum sunscreen SPF 30+ to sun-exposed areas, reapply every 2 hours as needed.  Recommend staying in the shade or wearing long sleeves, sun glasses (UVA+UVB protection) and wide brim hats (4-inch brim around the entire circumference of the hat). Call for new or changing lesions.   Destruction of lesion - L temple x 3, R temple x 1, nose x 1 (5) Complexity: simple   Destruction method: cryotherapy   Informed consent: discussed and consent obtained   Timeout:  patient name, date of birth, surgical site, and procedure verified Lesion destroyed using liquid nitrogen: Yes   Region frozen until ice ball extended beyond lesion: Yes   Outcome: patient tolerated procedure well with no complications   Post-procedure details: wound care instructions given     Return in about 1 year (around 12/14/2023) for TBSE.  Maylene Roes, CMA, am acting as scribe for Armida Sans, MD .  Documentation: I have reviewed the above documentation for accuracy and completeness, and I agree with the above.  Armida Sans, MD

## 2022-12-17 ENCOUNTER — Encounter: Payer: Self-pay | Admitting: Dermatology

## 2023-12-20 ENCOUNTER — Ambulatory Visit: Payer: Medicare PPO | Admitting: Dermatology
